# Patient Record
Sex: Female | Born: 1975 | Race: Black or African American | Hispanic: No | Marital: Married | Smoking: Never smoker
Health system: Southern US, Community
[De-identification: ages and names within clinical notes are randomized; demographics above are authoritative.]

## PROBLEM LIST (undated history)

## (undated) DIAGNOSIS — Z800 Family history of malignant neoplasm of digestive organs: Secondary | ICD-10-CM

## (undated) DIAGNOSIS — Z1379 Encounter for other screening for genetic and chromosomal anomalies: Secondary | ICD-10-CM

## (undated) DIAGNOSIS — B009 Herpesviral infection, unspecified: Secondary | ICD-10-CM

## (undated) HISTORY — DX: Herpesviral infection, unspecified: B00.9

## (undated) HISTORY — DX: Family history of malignant neoplasm of digestive organs: Z80.0

## (undated) HISTORY — DX: Encounter for other screening for genetic and chromosomal anomalies: Z13.79

## (undated) HISTORY — PX: OTHER SURGICAL HISTORY: SHX169

---

## 1999-07-04 ENCOUNTER — Other Ambulatory Visit: Admission: RE | Admit: 1999-07-04 | Discharge: 1999-07-04 | Payer: Self-pay

## 2005-03-24 ENCOUNTER — Emergency Department: Payer: Self-pay | Admitting: Emergency Medicine

## 2006-06-25 ENCOUNTER — Ambulatory Visit: Payer: Self-pay | Admitting: Gastroenterology

## 2006-06-25 HISTORY — PX: COLONOSCOPY: SHX174

## 2013-10-14 ENCOUNTER — Ambulatory Visit: Payer: Self-pay | Admitting: Physician Assistant

## 2013-10-14 LAB — URINALYSIS, COMPLETE
Glucose,UR: NEGATIVE mg/dL (ref 0–75)
Nitrite: POSITIVE
RBC,UR: >30 /HPF (ref 0–5)
Specific Gravity: 1.025 (ref 1.003–1.030)
WBC UR: >30 /HPF (ref 0–5)

## 2013-10-16 LAB — URINE CULTURE

## 2013-11-24 LAB — HM PAP SMEAR: HM PAP: NEGATIVE

## 2015-01-19 ENCOUNTER — Ambulatory Visit: Payer: Self-pay | Admitting: Obstetrics and Gynecology

## 2015-01-20 ENCOUNTER — Ambulatory Visit: Payer: Self-pay | Admitting: Obstetrics and Gynecology

## 2015-01-20 HISTORY — PX: COMBINED HYSTEROSCOPY DIAGNOSTIC / D&C: SUR297

## 2015-03-28 LAB — SURGICAL PATHOLOGY

## 2015-04-03 NOTE — Op Note (Signed)
PATIENT NAME:  Audrey Roberson, Audrey Roberson MR#:  [id number] DATE OF BIRTH:  August 29, 1976  DATE OF PROCEDURE:Simonne Come  01/20/2015  PREOPERATIVE DIAGNOSIS: Missed abortion at 6 weeks.   POSTOPERATIVE DIAGNOSIS: Missed abortion at 6 weeks.  PROCEDURE: Suction dilatation and curettage.   ANESTHESIA USED: General.   PRIMARY SURGEON: Florina OuAndreas M. Bonney AidStaebler, MD   ESTIMATED BLOOD LOSS: 20 mL.  OPERATIVE FLUIDS: Crystalloid 400 mL.   URINE OUTPUT: Clear urine 20 mL.   PREOPERATIVE ANTIBIOTICS: Doxycycline 100 mg.   COMPLICATIONS: None.   INTRAOPERATIVE FINDINGS: Roberson 6-8-week sized uterus, slightly retroverted, 7-9 cm, minimal to moderate amount of tissue removed on suction curettage.   SPECIMENS REMOVED: Products of conception.   CONDITION FOLLOWING PROCEDURE: Stable.   PROCEDURE IN DETAIL: Risks, benefits, and alternatives of the procedure were discussed with the patient prior to proceeding to the operating room. The patient had initially opted for expectant management then opted for medical management, both of which were unsuccessful in the patient passing the products of conception. The patient was taken to the operating room where she was placed under general anesthesia. She was prepped and draped in the usual sterile fashion, positioned in the dorsal lithotomy position using Allen stirrups. Timeout was performed. Attention was turned to the patient's pelvis. The bladder was straight catheterized with Roberson red rubber catheter. An operative speculum was placed. The anterior lip of the cervix was grasped with Roberson single-tooth tenaculum. The cervix was sequentially dilated using Pratt dilators. Roberson size 8 flexible suction curette was then introduced into the uterus up to the fundus. Several passes with the suction curette were undertaken. Sharp curettage was then performed noting Roberson good uterine cry throughout. Roberson final pass with the suction curette was then undertaken. The single-tooth tenaculum was removed. The cervix and  tenaculum site were inspected and noted to be hemostatic. The speculum was removed. Sponge, needle, and instrument counts were correct x 2. The patient tolerated the procedure well and was taken to the recovery room in stable condition.   ____________________________ Florina OuAndreas M. Bonney AidStaebler, MD ams:bm D: 01/21/2015 19:45:00 ET T: 01/22/2015 07:32:27 ET JOB#: [id number]  cc: Florina OuAndreas M. Bonney AidStaebler, MD, <Dictator> Lorrene ReidANDREAS M Jaquavian Firkus MD ELECTRONICALLY SIGNED 02/08/2015 21:01

## 2016-07-10 DIAGNOSIS — E6609 Other obesity due to excess calories: Secondary | ICD-10-CM | POA: Diagnosis not present

## 2016-09-12 ENCOUNTER — Ambulatory Visit
Admission: EM | Admit: 2016-09-12 | Discharge: 2016-09-12 | Disposition: A | Discharge status: Home / Self Care | Payer: BLUE CROSS/BLUE SHIELD | Attending: Family Medicine | Admitting: Family Medicine

## 2016-09-12 DIAGNOSIS — R300 Dysuria: Secondary | ICD-10-CM

## 2016-09-12 LAB — URINALYSIS COMPLETE WITH MICROSCOPIC (ARMC ONLY)
Bilirubin Urine: NEGATIVE
Glucose, UA: NEGATIVE mg/dL
Ketones, ur: NEGATIVE mg/dL
Nitrite: NEGATIVE
PH: 7 (ref 5.0–8.0)
PROTEIN: NEGATIVE mg/dL
Specific Gravity, Urine: 1.015 (ref 1.005–1.030)

## 2016-09-12 MED ORDER — SULFAMETHOXAZOLE-TRIMETHOPRIM 800-160 MG PO TABS: 1.0000 | ORAL_TABLET | Freq: Two times a day (BID) | ORAL | 0 refills | Status: AC

## 2016-09-12 NOTE — Discharge Instructions (Signed)
Take medication as prescribed. Rest. Drink plenty of fluids.  ° °Follow up with your primary care physician this week as needed. Return to Urgent care for new or worsening concerns.  ° °

## 2016-09-12 NOTE — ED Provider Notes (Signed)
[facility] ____________________________________________  Time seen: Approximately 1:23 PM  I have reviewed the triage vital signs and the nursing notes.   HISTORY  Chief Complaint Urinary Tract Infection   HPI Audrey Roberson is a 40 y.o. female presents for the complaints of 2-3 days of urinary frequency and burning with urination. Patient reports she has a history of urinary tract infection in the past that felt similarly. Reports has not had a UTI in the last several years. Patient denies any burning discomfort in absence of active urination. Denies any vaginal complaints. Denies any vaginal discharge, vaginal odor, vaginal burning. Denies any concerns of STDs. Patient states that she does sometimes feel like she holds her urine longer than she should at work.  Reports continues to eat and drink well. Reports continues with normal activities.Denies fevers, abdominal pain, back pain, diarrhea or constipation, recent sickness or recent antibiotic use.  No LMP recorded. Patient is not currently having periods (Reason: IUD). Denies pregnancy.  History reviewed. No pertinent past medical history.  There are no active problems to display for this patient.   Past Surgical History:  Procedure Laterality Date  . c-section        No current facility-administered medications for this encounter.   Current Outpatient Prescriptions:  .  sulfamethoxazole-trimethoprim (BACTRIM DS,SEPTRA DS) 800-160 MG tablet, Take 1 tablet by mouth 2 (two) times daily., Disp: 6 tablet, Rfl: 0  Allergies Review of patient's allergies indicates no known allergies.  History reviewed. No pertinent family history.  Social History Social History  Substance Use Topics  . Smoking status: Never Smoker  . Smokeless tobacco: Never Used  . Alcohol use No    Review of Systems Constitutional: No fever/chills Eyes: No visual changes. ENT: No sore throat. Cardiovascular: Denies chest  pain. Respiratory: Denies shortness of breath. Gastrointestinal: No abdominal pain.  No nausea, no vomiting.  No diarrhea.  No constipation. Genitourinary: Positiveor dysuria. Musculoskeletal: Negative for back pain. Skin: Negative for rash. Neurological: Negative for headaches, focal weakness or numbness.  10-point ROS otherwise negative.  ____________________________________________   PHYSICAL EXAM:  VITAL SIGNS: ED Triage Vitals  Enc Vitals Group     BP 09/12/16 1307 103/75     Pulse Rate 09/12/16 1307 78     Resp 09/12/16 1307 18     Temp 09/12/16 1307 98 F (36.7 C)     Temp Source 09/12/16 1307 Oral     SpO2 09/12/16 1307 100 %     Weight 09/12/16 1308 178 lb (80.7 kg)     Height 09/12/16 1308 5\' 8"  (1.727 m)     Head Circumference --      Peak Flow --      Pain Score 09/12/16 1310 3     Pain Loc --      Pain Edu? --      Excl. in GC? --     Constitutional: Alert and oriented. Well appearing and in no acute distress. Eyes: Conjunctivae are normal. PERRL. EOMI. ENT      Head: Normocephalic and atraumatic.      Nose: No congestion/rhinnorhea.      Mouth/Throat: Mucous membranes are moist. Cardiovascular: Normal rate, regular rhythm. Grossly normal heart sounds.  Good peripheral circulation. Respiratory: Normal respiratory effort without tachypnea nor retractions. Breath sounds are clear and equal bilaterally. No wheezes/rales/rhonchi.. Gastrointestinal: Soft and nontender. Normal Bowel sounds. No CVA tenderness. Musculoskeletal: Ambulatory with steady gait. No midline cervical, thoracic or lumbar tenderness to palpation. Neurologic:  Normal  speech and language. No gross focal neurologic deficits are appreciated. Speech is normal. No gait instability.  Skin:  Skin is warm, dry and intact. No rash noted. Psychiatric: Mood and affect are normal. Speech and behavior are normal. Patient exhibits appropriate insight and judgment    ___________________________________________   LABS (all labs ordered are listed, but only abnormal results are displayed)  Labs Reviewed  URINALYSIS COMPLETEWITH MICROSCOPIC ([facility] ONLY) - Abnormal; Notable for the following:       Result Value   Hgb urine dipstick TRACE (*)    Leukocytes, UA TRACE (*)    Bacteria, UA FEW (*)    Squamous Epithelial / LPF 6-30 (*)    All other components within normal limits  URINE CULTURE   ____________________________________________  PROCEDURES Procedures    INITIAL IMPRESSION / ASSESSMENT AND PLAN / ED COURSE  Pertinent labs & imaging results that were available during my care of the patient were reviewed by me and considered in my medical decision making (see chart for details).  Well-appearing patient. Presents for the complaints of dysuria 2-3 days. Urinalysis reviewed. Few bacteria, 6-30 score was epithelial cells, trace hemoglobin and trace leukocytes. Discussed in detail with patient concern for possible contaminated sample and not clear UTI. Patient declined vaginal complaints and declined pelvic exam. Will treat patient for concern for UTI with oral Bactrim 3 days. Will culture urine. Discussed indication, risks and benefits of medications with patient.  Discussed follow up with Primary care physician this week. Discussed follow up and return parameters including no resolution or any worsening concerns. Patient verbalized understanding and agreed to plan.   ____________________________________________   FINAL CLINICAL IMPRESSION(S) / ED DIAGNOSES  Final diagnoses:  Dysuria     Discharge Medication List as of 09/12/2016  1:49 PM    START taking these medications   Details  sulfamethoxazole-trimethoprim (BACTRIM DS,SEPTRA DS) 800-160 MG tablet Take 1 tablet by mouth 2 (two) times daily., Starting Wed 09/12/2016, Until Sat 09/15/2016, Normal        Note: This dictation was prepared with Dragon dictation along with smaller  phrase technology. Any transcriptional errors that result from this process are unintentional.    Clinical Course      Renford Dills, NP 09/12/16 1507

## 2016-09-12 NOTE — ED Triage Notes (Signed)
Patient c/o frequency and discomfort during urination.

## 2016-09-13 LAB — URINE CULTURE: Culture: >=100000 — AB

## 2016-09-14 ENCOUNTER — Telehealth: Payer: Self-pay | Admitting: *Deleted

## 2016-09-14 NOTE — Telephone Encounter (Signed)
Called patient, verified DOB, communicated urine culture positive for bacteria. Patient reported feeling much better. Advised patient to follow up with PCP or [facility] if symptoms return.

## 2017-01-16 DIAGNOSIS — L810 Postinflammatory hyperpigmentation: Secondary | ICD-10-CM | POA: Diagnosis not present

## 2017-01-16 DIAGNOSIS — L700 Acne vulgaris: Secondary | ICD-10-CM | POA: Diagnosis not present

## 2017-03-12 ENCOUNTER — Ambulatory Visit (INDEPENDENT_AMBULATORY_CARE_PROVIDER_SITE_OTHER): Payer: BLUE CROSS/BLUE SHIELD | Admitting: Obstetrics and Gynecology

## 2017-03-12 ENCOUNTER — Encounter: Payer: Self-pay | Admitting: Obstetrics and Gynecology

## 2017-03-12 VITALS — BP 110/74 | HR 67 | Ht 68.0 in | Wt 163.0 lb

## 2017-03-12 DIAGNOSIS — R87612 Low grade squamous intraepithelial lesion on cytologic smear of cervix (LGSIL): Secondary | ICD-10-CM | POA: Diagnosis not present

## 2017-03-12 DIAGNOSIS — Z1151 Encounter for screening for human papillomavirus (HPV): Secondary | ICD-10-CM | POA: Diagnosis not present

## 2017-03-12 DIAGNOSIS — Z800 Family history of malignant neoplasm of digestive organs: Secondary | ICD-10-CM | POA: Diagnosis not present

## 2017-03-12 DIAGNOSIS — Z1239 Encounter for other screening for malignant neoplasm of breast: Secondary | ICD-10-CM

## 2017-03-12 DIAGNOSIS — Z124 Encounter for screening for malignant neoplasm of cervix: Secondary | ICD-10-CM | POA: Diagnosis not present

## 2017-03-12 DIAGNOSIS — Z01419 Encounter for gynecological examination (general) (routine) without abnormal findings: Secondary | ICD-10-CM | POA: Diagnosis not present

## 2017-03-12 DIAGNOSIS — Z30431 Encounter for routine checking of intrauterine contraceptive device: Secondary | ICD-10-CM

## 2017-03-12 DIAGNOSIS — Z1231 Encounter for screening mammogram for malignant neoplasm of breast: Secondary | ICD-10-CM

## 2017-03-12 NOTE — Progress Notes (Signed)
HPI:      [name] is a 41 y.o. G2P0011 who LMP was No LMP recorded. Patient is not currently having periods (Reason: IUD)., presents today for her annual examination.  Her menses are absent due to IUD.  Dysmenorrhea occasional. She does have intermenstrual bleeding. She is much happier with the IUD this yr than last.  Sex activity: single partner, contraception - IUD. Mirena placed 03/09/15.  Last Pap: November 04, 2013  Results were: no abnormalities /neg HPV DNA  Hx of STDs: HSV  Last mammogram: February 10, 2016  Results were: normal--routine follow-up in 12 months There is no FH of breast cancer. There is no FH of ovarian cancer. The patient does do self-breast exams. The pt's mom had colon cancer and recurrence and her mat aunt and uncle had pancreatic cancer. Pt is My Risk neg 2016. Pt never had colonoscopy last yr but would like one this yr due to FH.  Tobacco use: The patient denies current or previous tobacco use. Alcohol use: none Exercise: moderately active  She does get adequate calcium and Vitamin D in her diet.   Past Medical History:  Diagnosis Date  . Family history of colon cancer in mother   . Genetic testing    Negative Colaris  . Routine cultures positive for HSV2     Past Surgical History:  Procedure Laterality Date  . c-section    . COLONOSCOPY  06/25/2006  . COMBINED HYSTEROSCOPY DIAGNOSTIC / D&C  01/20/2015    Family History  Problem Relation Age of Onset  . Colon cancer Mother   . Pancreatic cancer Maternal Uncle   . Pancreatic cancer Maternal Aunt   . Prostate cancer Father    Social History   Social History  . Marital status: Married    Spouse name: N/A  . Number of children: N/A  . Years of education: N/A   Occupational History  . Not on file.   Social History Main Topics  . Smoking status: Never Smoker  . Smokeless tobacco: Never Used  . Alcohol use No  . Drug use: No  . Sexual activity: Yes    Birth control/  protection: IUD   Other Topics Concern  . Not on file   Social History Narrative  . No narrative on file    Current Outpatient Prescriptions:  .  levonorgestrel (MIRENA) 20 MCG/24HR IUD, 1 each by Intrauterine route once., Disp: , Rfl:  .  hydrocortisone 2.5 % cream, , Disp: , Rfl:  .  spironolactone (ALDACTONE) 50 MG tablet, , Disp: , Rfl:  .  tretinoin (RETIN-A) 0.025 % cream, , Disp: , Rfl:   ROS:  Review of Systems  Constitutional: Negative for fever, malaise/fatigue and weight loss.  HENT: Negative for congestion, ear pain and sinus pain.   Respiratory: Negative for cough, shortness of breath and wheezing.   Cardiovascular: Negative for chest pain, orthopnea and leg swelling.  Gastrointestinal: Negative for constipation, diarrhea, nausea and vomiting.  Genitourinary: Negative for dysuria, frequency, hematuria and urgency.       Breast ROS: negative   Musculoskeletal: Negative for back pain, joint pain and myalgias.  Skin: Negative for itching and rash.  Neurological: Negative for dizziness, tingling, focal weakness and headaches.  Endo/Heme/Allergies: Negative for environmental allergies. Does not bruise/bleed easily.  Psychiatric/Behavioral: Negative for depression and suicidal ideas. The patient is not nervous/anxious and does not have insomnia.     Objective: BP 110/74 (BP Location: Left Arm, Patient Position:  Sitting, Cuff Size: Normal)   Pulse 67   Ht  (1.727 m)   Wt 163 lb (73.9 kg)   BMI 24.78 kg/m    Physical Exam  Constitutional: She is oriented to person, place, and time. She appears well-developed and well-nourished.  Genitourinary: Vagina normal and uterus normal. No erythema or tenderness in the vagina. No vaginal discharge found. Right adnexum does not display mass and does not display tenderness. Left adnexum does not display mass and does not display tenderness.  Cervix exhibits visible IUD strings. Cervix does not exhibit motion tenderness or  polyp. Uterus is not enlarged or tender.  Neck: Normal range of motion. No thyromegaly present.  Cardiovascular: Normal rate, regular rhythm and normal heart sounds.   No murmur heard. Pulmonary/Chest: Effort normal and breath sounds normal. Right breast exhibits no mass, no nipple discharge, no skin change and no tenderness. Left breast exhibits no mass, no nipple discharge, no skin change and no tenderness.  Abdominal: Soft. There is no tenderness. There is no guarding.  Musculoskeletal: Normal range of motion.  Neurological: She is alert and oriented to person, place, and time. No cranial nerve deficit.  Psychiatric: She has a normal mood and affect. Her behavior is normal.  Vitals reviewed.    Assessment/Plan: Encounter for annual routine gynecological examination  Cervical cancer screening - Plan: IGP, Aptima HPV  Screening for HPV (human papillomavirus) - Plan: IGP, Aptima HPV  Encounter for routine checking of intrauterine contraceptive device (IUD)  Family history of colon cancer - Pt is My Risk neg but due for scr colonoscopy due to FH. Will refer to GI. Cont Vit D supp. - Plan: Ambulatory referral to Gastroenterology  Screening for breast cancer - Pt to sched mammo. - Plan: MM DIGITAL SCREENING BILATERAL            GYN counsel mammography screening, adequate intake of calcium and vitamin D, diet and exercise     F/U  Return in about 1 year (around 03/12/2018).  Arleen Bar B. Tonesha Tsou, PA-C 03/12/2017 12:05 PM

## 2017-03-15 LAB — IGP, APTIMA HPV
HPV APTIMA: NEGATIVE
PAP Smear Comment: 0

## 2017-03-18 ENCOUNTER — Telehealth: Payer: Self-pay | Admitting: Obstetrics and Gynecology

## 2017-03-18 DIAGNOSIS — R87612 Low grade squamous intraepithelial lesion on cytologic smear of cervix (LGSIL): Secondary | ICD-10-CM

## 2017-03-18 NOTE — Telephone Encounter (Signed)
Pt aware of abn pap (LSIL, neg high risk HPV DNA), needing colpo. No hx of abn paps. Sara, pls sched with AMS. Thx.

## 2017-03-19 NOTE — Telephone Encounter (Signed)
Called and schedule pt  04/09/17 with AMS

## 2017-03-25 ENCOUNTER — Other Ambulatory Visit: Payer: Self-pay

## 2017-03-25 DIAGNOSIS — Z1211 Encounter for screening for malignant neoplasm of colon: Secondary | ICD-10-CM

## 2017-03-25 NOTE — Progress Notes (Signed)
Gastroenterology Pre-Procedure Review  Request Date: Apr 08, 2017 Requesting Physician: Dr. Servando Snare  PATIENT REVIEW QUESTIONS: The patient responded to the following health history questions as indicated:    1. Are you having any GI issues? no 2. Do you have a personal history of Polyps? no 3. Do you have a family history of Colon Cancer or Polyps? yes (Mother Colon Cancer) 4. Diabetes Mellitus? no 5. Joint replacements in the past 12 months?no 6. Major health problems in the past 3 months?no 7. Any artificial heart valves, MVP, or defibrillator?no    MEDICATIONS & ALLERGIES:    Patient reports the following regarding taking any anticoagulation/antiplatelet therapy:   Plavix, Coumadin, Eliquis, Xarelto, Lovenox, Pradaxa, Brilinta, or Effient? no Aspirin? no  Patient confirms/reports the following medications:  Current Outpatient Prescriptions  Medication Sig Dispense Refill  . hydrocortisone 2.5 % cream     . levonorgestrel (MIRENA) 20 MCG/24HR IUD 1 each by Intrauterine route once.    Marland Kitchen spironolactone (ALDACTONE) 50 MG tablet     . tretinoin (RETIN-A) 0.025 % cream      No current facility-administered medications for this visit.     Patient confirms/reports the following allergies:  No Known Allergies  No orders of the defined types were placed in this encounter.   AUTHORIZATION INFORMATION Primary Insurance: 1D#: Group #:  Secondary Insurance: 1D#: Group #:  SCHEDULE INFORMATION: Date: May 7th, 2018 Time: Location: [facility]

## 2017-03-26 ENCOUNTER — Telehealth: Payer: Self-pay | Admitting: Gastroenterology

## 2017-03-26 NOTE — Telephone Encounter (Signed)
03/26/17 Spoke with Jasmine December at [facility] and NO prior auth required for 16109 / Z80.0 Ref# [id number].

## 2017-04-04 ENCOUNTER — Encounter: Payer: Self-pay | Admitting: *Deleted

## 2017-04-08 ENCOUNTER — Ambulatory Visit: Payer: BLUE CROSS/BLUE SHIELD | Admitting: Anesthesiology

## 2017-04-08 ENCOUNTER — Encounter
Admission: RE | Disposition: A | Discharge status: Home / Self Care | Payer: Self-pay | Source: Ambulatory Visit | Attending: Gastroenterology

## 2017-04-08 ENCOUNTER — Ambulatory Visit
Admission: RE | Admit: 2017-04-08 | Discharge: 2017-04-08 | Disposition: A | Discharge status: Home / Self Care | Payer: BLUE CROSS/BLUE SHIELD | Source: Ambulatory Visit | Attending: Gastroenterology | Admitting: Gastroenterology

## 2017-04-08 DIAGNOSIS — Z800 Family history of malignant neoplasm of digestive organs: Secondary | ICD-10-CM

## 2017-04-08 DIAGNOSIS — K5730 Diverticulosis of large intestine without perforation or abscess without bleeding: Secondary | ICD-10-CM | POA: Diagnosis not present

## 2017-04-08 DIAGNOSIS — Z79899 Other long term (current) drug therapy: Secondary | ICD-10-CM | POA: Diagnosis not present

## 2017-04-08 DIAGNOSIS — Z1211 Encounter for screening for malignant neoplasm of colon: Secondary | ICD-10-CM | POA: Diagnosis not present

## 2017-04-08 DIAGNOSIS — K641 Second degree hemorrhoids: Secondary | ICD-10-CM | POA: Insufficient documentation

## 2017-04-08 HISTORY — PX: COLONOSCOPY WITH PROPOFOL: SHX5780

## 2017-04-08 SURGERY — COLONOSCOPY WITH PROPOFOL
Anesthesia: Monitor Anesthesia Care | Wound class: Contaminated

## 2017-04-08 MED ORDER — STERILE WATER FOR IRRIGATION IR SOLN
Status: DC | PRN
  Administered 2017-04-08: 08:00:00

## 2017-04-08 MED ORDER — LIDOCAINE HCL (CARDIAC) 20 MG/ML IV SOLN
INTRAVENOUS | Status: DC | PRN
Start: 2017-04-08 — End: 2017-04-08
  Administered 2017-04-08: 50 mg via INTRAVENOUS

## 2017-04-08 MED ORDER — LACTATED RINGERS IV SOLN
INTRAVENOUS | Status: DC
  Administered 2017-04-08: 08:00:00 via INTRAVENOUS

## 2017-04-08 MED ORDER — ACETAMINOPHEN 325 MG PO TABS: 325.0000 mg | ORAL_TABLET | ORAL | Status: DC | PRN

## 2017-04-08 MED ORDER — PROPOFOL 10 MG/ML IV BOLUS
INTRAVENOUS | Status: DC | PRN
  Administered 2017-04-08: 50 mg via INTRAVENOUS
  Administered 2017-04-08: 40 mg via INTRAVENOUS
  Administered 2017-04-08: 100 mg via INTRAVENOUS
  Administered 2017-04-08: 50 mg via INTRAVENOUS

## 2017-04-08 MED ORDER — ACETAMINOPHEN 160 MG/5ML PO SOLN: 325.0000 mg | ORAL | Status: DC | PRN

## 2017-04-08 SURGICAL SUPPLY — 23 items

## 2017-04-08 NOTE — Anesthesia Postprocedure Evaluation (Signed)
Anesthesia Post Note  Patient: Audrey Roberson  Procedure(s) Performed: Procedure(s) (LRB): COLONOSCOPY WITH PROPOFOL (N/A)  Patient location during evaluation: PACU Anesthesia Type: MAC Level of consciousness: awake and alert and oriented Pain management: satisfactory to patient Vital Signs Assessment: post-procedure vital signs reviewed and stable Respiratory status: spontaneous breathing, nonlabored ventilation and respiratory function stable Cardiovascular status: blood pressure returned to baseline and stable Postop Assessment: Adequate PO intake and No signs of nausea or vomiting Anesthetic complications: no    Raliegh Ip

## 2017-04-08 NOTE — Anesthesia Procedure Notes (Signed)
Procedure Name: MAC Date/Time: 04/08/2017 8:20 AM Performed by: Cameron Ali Pre-anesthesia Checklist: Patient identified, Emergency Drugs available, Suction available, Timeout performed and Patient being monitored Patient Re-evaluated:Patient Re-evaluated prior to inductionOxygen Delivery Method: Nasal cannula Placement Confirmation: positive ETCO2

## 2017-04-08 NOTE — Op Note (Signed)
[facility] Gastroenterology Patient Name: Audrey Roberson Procedure Date: 04/08/2017 8:13 AM MRN: [id number] Account #: [id number] Date of Birth: December 21, 1975 Admit Type: Outpatient Age: 41 Room: [facility] OR ROOM 01 Gender: Female Note Status: Finalized Procedure:            Colonoscopy Indications:          Family history of anal canal cancer in a first-degree                        relative Providers:            Midge Minium MD, MD Medicines:            Propofol per Anesthesia Complications:        No immediate complications. Procedure:            Pre-Anesthesia Assessment:                       - Prior to the procedure, a History and Physical was                        performed, and patient medications and allergies were                        reviewed. The patient's tolerance of previous                        anesthesia was also reviewed. The risks and benefits of                        the procedure and the sedation options and risks were                        discussed with the patient. All questions were                        answered, and informed consent was obtained. Prior                        Anticoagulants: The patient has taken no previous                        anticoagulant or antiplatelet agents. ASA Grade                        Assessment: II - A patient with mild systemic disease.                        After reviewing the risks and benefits, the patient was                        deemed in satisfactory condition to undergo the                        procedure.                       After obtaining informed consent, the colonoscope was                        passed under direct vision. Throughout the  procedure,                        the patient's blood pressure, pulse, and oxygen                        saturations were monitored continuously. The Olympus CF                        H180AL Colonoscope (S#: [id number]) was introduced through               the anus and advanced to the the cecum, identified by                        appendiceal orifice and ileocecal valve. The                        colonoscopy was performed without difficulty. The                        patient tolerated the procedure well. The quality of                        the bowel preparation was good. Findings:      The perianal and digital rectal examinations were normal.      A few small-mouthed diverticula were found in the sigmoid colon and       descending colon.      Non-bleeding internal hemorrhoids were found during retroflexion. The       hemorrhoids were Grade II (internal hemorrhoids that prolapse but reduce       spontaneously). Impression:           - Diverticulosis in the sigmoid colon and in the                        descending colon.                       - Non-bleeding internal hemorrhoids.                       - No specimens collected. Recommendation:       - Discharge patient to home.                       - Resume previous diet.                       - Continue present medications.                       - Repeat colonoscopy in 5 years for surveillance. Procedure Code(s):    --- Professional ---                       [phone], Colonoscopy, flexible; diagnostic, including                        collection of specimen(s) by brushing or washing, when                        performed (separate procedure) Diagnosis Code(s):    --- Professional ---  Z80.0, Family history of malignant neoplasm of                        digestive organs CPT copyright 2016 American Medical Association. All rights reserved. The codes documented in this report are preliminary and upon coder review may  be revised to meet current compliance requirements. Midge Minium MD, MD 04/08/2017 8:34:59 AM This report has been signed electronically. Number of Addenda: 0 Note Initiated On: 04/08/2017 8:13 AM Scope Withdrawal Time: 0 hours 6 minutes 32  seconds  Total Procedure Duration: 0 hours 9 minutes 38 seconds       [facility]

## 2017-04-08 NOTE — Anesthesia Preprocedure Evaluation (Signed)
Anesthesia Evaluation  Patient identified by MRN, date of birth, ID band Patient awake    Reviewed: Allergy & Precautions, H&P , NPO status , Patient's Chart, lab work & pertinent test results  Airway Mallampati: I  TM Distance: >3 FB Neck ROM: full    Dental no notable dental hx.    Pulmonary    Pulmonary exam normal        Cardiovascular Normal cardiovascular exam     Neuro/Psych    GI/Hepatic   Endo/Other    Renal/GU      Musculoskeletal   Abdominal   Peds  Hematology   Anesthesia Other Findings   Reproductive/Obstetrics                             Anesthesia Physical Anesthesia Plan  ASA: II  Anesthesia Plan: MAC   Post-op Pain Management:    Induction:   Airway Management Planned:   Additional Equipment:   Intra-op Plan:   Post-operative Plan:   Informed Consent: I have reviewed the patients History and Physical, chart, labs and discussed the procedure including the risks, benefits and alternatives for the proposed anesthesia with the patient or authorized representative who has indicated his/her understanding and acceptance.     Plan Discussed with:   Anesthesia Plan Comments:         Anesthesia Quick Evaluation

## 2017-04-08 NOTE — H&P (Addendum)
   Midge Miniumarren Murline Weigel, MD [facility] [address] [place], [place] [address] Phone: [phone] Fax : [phone]  Primary Care Physician:  Patient, No Pcp Per Primary Gastroenterologist:  Dr. Servando SnareWohl  Pre-Procedure History & Physical: HPI:  Audrey Roberson is a 41 y.o. female is here for a mother with colon cancer.   Past Medical History:  Diagnosis Date  . Family history of colon cancer in mother   . Genetic testing    Negative Colaris  . Routine cultures positive for HSV2     Past Surgical History:  Procedure Laterality Date  . c-section    . COLONOSCOPY  06/25/2006  . COMBINED HYSTEROSCOPY DIAGNOSTIC / D&C  01/20/2015    Prior to Admission medications   Medication Sig Start Date End Date Taking? Authorizing Provider  B Complex-C (SUPER B COMPLEX PO) Take by mouth daily.   Yes [provider]  Cholecalciferol (VITAMIN D PO) Take by mouth daily.   Yes [provider]  hydrocortisone 2.5 % cream  01/20/17  Yes [provider]  levonorgestrel (MIRENA) 20 MCG/24HR IUD 1 each by Intrauterine route once.   Yes [provider]  Misc Natural Products (CORTISOL PO) Take by mouth daily.   Yes [provider]  Omega-3 Fatty Acids (FISH OIL PO) Take by mouth daily.   Yes [provider]  OVER THE COUNTER MEDICATION daily. Liver Rescue   Yes [provider]  spironolactone (ALDACTONE) 50 MG tablet  03/04/17  Yes [provider]  tretinoin (RETIN-A) 0.025 % cream  01/20/17  Yes [provider]    Allergies as of 03/25/2017  . (No Known Allergies)    Family History  Problem Relation Age of Onset  . Colon cancer Mother   . Pancreatic cancer Maternal Uncle   . Pancreatic cancer Maternal Aunt   . Prostate cancer Father     Social History   Social History  . Marital status: Married    Spouse name: N/A  . Number of children: N/A  . Years of education: N/A   Occupational History  . Not on file.    Social History Main Topics  . Smoking status: Never Smoker  . Smokeless tobacco: Never Used  . Alcohol use Yes     Comment: rare - Holidays  . Drug use: No  . Sexual activity: Yes    Birth control/ protection: IUD   Other Topics Concern  . Not on file   Social History Narrative  . No narrative on file    Review of Systems: See HPI, otherwise negative ROS  Physical Exam: BP 98/69   Pulse 90   Temp 99.1 F (37.3 C) (Temporal)   Resp 16   Ht 5\' 8"  (1.727 m)   Wt 159 lb (72.1 kg)   SpO2 98%   BMI 24.18 kg/m  General:   Alert,  pleasant and cooperative in NAD Head:  Normocephalic and atraumatic. Neck:  Supple; no masses or thyromegaly. Lungs:  Clear throughout to auscultation.    Heart:  Regular rate and rhythm. Abdomen:  Soft, nontender and nondistended. Normal bowel sounds, without guarding, and without rebound.   Neurologic:  Alert and  oriented x4;  grossly normal neurologically.  Impression/Plan: Audrey ComeAlecia A Halbleib is now here to undergo a colonoscopy due to mother with colon cancer.  Risks, benefits, and alternatives regarding colonoscopy have been reviewed with the patient.  Questions have been answered.  All parties agreeable.

## 2017-04-08 NOTE — Transfer of Care (Signed)
Immediate Anesthesia Transfer of Care Note  Patient: Audrey Roberson  Procedure(s) Performed: Procedure(s): COLONOSCOPY WITH PROPOFOL (N/A)  Patient Location: PACU  Anesthesia Type: MAC  Level of Consciousness: awake, alert  and patient cooperative  Airway and Oxygen Therapy: Patient Spontanous Breathing and Patient connected to supplemental oxygen  Post-op Assessment: Post-op Vital signs reviewed, Patient's Cardiovascular Status Stable, Respiratory Function Stable, Patent Airway and No signs of Nausea or vomiting  Post-op Vital Signs: Reviewed and stable  Complications: No apparent anesthesia complications

## 2017-04-09 ENCOUNTER — Ambulatory Visit (INDEPENDENT_AMBULATORY_CARE_PROVIDER_SITE_OTHER): Payer: BLUE CROSS/BLUE SHIELD | Admitting: Obstetrics and Gynecology

## 2017-04-09 ENCOUNTER — Encounter: Payer: Self-pay | Admitting: Gastroenterology

## 2017-04-09 VITALS — BP 100/68 | HR 70 | Wt 166.0 lb

## 2017-04-09 DIAGNOSIS — R87612 Low grade squamous intraepithelial lesion on cytologic smear of cervix (LGSIL): Secondary | ICD-10-CM | POA: Diagnosis not present

## 2017-04-09 NOTE — Progress Notes (Signed)
GYNECOLOGY CLINIC COLPOSCOPY PROCEDURE NOTE  41 y.o. 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 Discussed underlying role for HPV infection in the development of cervical dysplasia, its natural history and progression/regression, need for surveillance.  We discussed LGSIL pap HPV negative preferred triage is follow up pap and co-testing in 1 year.  Given low probability of dysplasia decision made to proceed with colposcopy but not to obtain a random biopsy if no visible lesions.  Is the patient  pregnant: No LMP: No LMP recorded. Patient is not currently having periods (Reason: IUD). Smoking status:  Metrics: Intervention Frequency ACO  Documented Smoking Status Yearly  Screened one or more times in 24 months  Cessation Counseling or  Active cessation medication Past 24 months  Past 24 months   Guideline developer: UpToDate (See UpToDate for funding source) Date Released: 2014   Contraception: IUD   Patient given informed consent, signed copy in the chart, time out was performed.  The patient was position in dorsal lithotomy position. Speculum was placed the cervix was visualized.   After application of acetic acid colposcopic inspection of the cervix was undertaken.   Colposcopy adequate, full visualization of transformation zone: Yes no visible lesions; biopsies were not obtained.   ECC specimen obtained:  No All specimens were labeled and sent to pathology.   Patient was given post procedure instructions.  Will follow up pathology and manage accordingly.  Routine preventative health maintenance measures emphasized.  Physical Exam  Genitourinary:      Vena AustriaAndreas Ruger Saxer, MD, Merlinda FrederickFACOG [facility], [facility]

## 2017-05-21 ENCOUNTER — Ambulatory Visit
Admission: EM | Admit: 2017-05-21 | Discharge: 2017-05-21 | Disposition: A | Discharge status: Home / Self Care | Payer: BLUE CROSS/BLUE SHIELD | Attending: Family Medicine | Admitting: Family Medicine

## 2017-05-21 DIAGNOSIS — N3001 Acute cystitis with hematuria: Secondary | ICD-10-CM

## 2017-05-21 DIAGNOSIS — N390 Urinary tract infection, site not specified: Secondary | ICD-10-CM

## 2017-05-21 LAB — URINALYSIS, COMPLETE (UACMP) WITH MICROSCOPIC
Bilirubin Urine: NEGATIVE
GLUCOSE, UA: NEGATIVE mg/dL
Ketones, ur: NEGATIVE mg/dL
Nitrite: NEGATIVE
PH: 7 (ref 5.0–8.0)
Protein, ur: NEGATIVE mg/dL
Specific Gravity, Urine: 1.015 (ref 1.005–1.030)

## 2017-05-21 MED ORDER — PHENAZOPYRIDINE HCL 200 MG PO TABS
200.0000 mg | ORAL_TABLET | Freq: Three times a day (TID) | ORAL | 0 refills | Status: AC | PRN
Start: 2017-05-21 — End: ?

## 2017-05-21 MED ORDER — CEPHALEXIN 500 MG PO CAPS: 500.0000 mg | ORAL_CAPSULE | Freq: Three times a day (TID) | ORAL | 0 refills | Status: AC

## 2017-05-21 NOTE — ED Triage Notes (Signed)
Patient complains of urinary frequency, urgency and painful urination that started 3 days ago and has been worsening.

## 2017-05-21 NOTE — ED Provider Notes (Signed)
[facility]    CSN: [id number] Arrival date & time: 05/21/17  1105     History   Chief Complaint Chief Complaint  Patient presents with  . Urinary Tract Infection    HPI Audrey Roberson is a 41 y.o. female.   Patient is a [age] year old black female who has a history of UTIs who started having symptoms days ago. She states started on Sunday and is now Tuesday. She states she's had a history of UTIs before in the past. She reports that when she is unable to go to the bathroom on a regular basis and has to hold her urine seems UTIs occur then. She denies any medical problems of significance. She does not smoke. Family history is positive for mother with colon cancer. Other than do see a colonoscopy no other surgeries or operations no known drug allergies.   The history is provided by the patient. No language interpreter was used.  Urinary Tract Infection  Pain quality:  Sharp Pain severity:  Moderate Timing:  Constant Progression:  Worsening Chronicity:  New Recent urinary tract infections: no   Relieved by:  Nothing Worsened by:  Nothing Ineffective treatments:  None tried Urinary symptoms: discolored urine, frequent urination and hematuria   Associated symptoms: abdominal pain   Risk factors: recurrent urinary tract infections and sexually active     Past Medical History:  Diagnosis Date  . Family history of colon cancer in mother   . Genetic testing    Negative Colaris  . Routine cultures positive for HSV2     Patient Active Problem List   Diagnosis Date Noted  . Family history of malignant neoplasm of gastrointestinal tract   . LGSIL on Pap smear of cervix 03/18/2017  . Family history of colon cancer 03/12/2017    Past Surgical History:  Procedure Laterality Date  . c-section    . COLONOSCOPY  06/25/2006  . COLONOSCOPY WITH PROPOFOL N/A 04/08/2017   Procedure: COLONOSCOPY WITH PROPOFOL;  Surgeon: Midge Minium, MD;  Location: [facility];   Service: Endoscopy;  Laterality: N/A;  . COMBINED HYSTEROSCOPY DIAGNOSTIC / D&C  01/20/2015    OB History    Gravida Para Term Preterm AB Living   2 1     1 1    SAB TAB Ectopic Multiple Live Births   1               Home Medications    Prior to Admission medications   Medication Sig Start Date End Date Taking? Authorizing Provider  B Complex-C (SUPER B COMPLEX PO) Take by mouth daily.   Yes [provider]  Cholecalciferol (VITAMIN D PO) Take by mouth daily.   Yes [provider]  hydrocortisone 2.5 % cream  01/20/17  Yes [provider]  levonorgestrel (MIRENA) 20 MCG/24HR IUD 1 each by Intrauterine route once.   Yes [provider]  Misc Natural Products (CORTISOL PO) Take by mouth daily.   Yes [provider]  Omega-3 Fatty Acids (FISH OIL PO) Take by mouth daily.   Yes [provider]  OVER THE COUNTER MEDICATION daily. Liver Rescue   Yes [provider]  spironolactone (ALDACTONE) 50 MG tablet  03/04/17  Yes [provider]  tretinoin (RETIN-A) 0.025 % cream  01/20/17  Yes [provider]  cephALEXin (KEFLEX) 500 MG capsule Take 1 capsule (500 mg total) by mouth 3 (three) times daily. 05/21/17   Hassan Rowan, MD  phenazopyridine (PYRIDIUM) 200 MG  tablet Take 1 tablet (200 mg total) by mouth 3 (three) times daily as needed for pain. 05/21/17   Hassan RowanWade, Talayia Hjort, MD    Family History Family History  Problem Relation Age of Onset  . Colon cancer Mother   . Pancreatic cancer Maternal Uncle   . Pancreatic cancer Maternal Aunt   . Prostate cancer Father     Social History Social History  Substance Use Topics  . Smoking status: Never Smoker  . Smokeless tobacco: Never Used  . Alcohol use Yes     Comment: rare - Holidays     Allergies   Patient has no known allergies.   Review of Systems Review of Systems  Gastrointestinal: Positive for abdominal pain.  Genitourinary: Positive for dysuria,  frequency and pelvic pain.  All other systems reviewed and are negative.    Physical Exam Triage Vital Signs ED Triage Vitals  Enc Vitals Group     BP 05/21/17 1247 108/71     Pulse Rate 05/21/17 1247 69     Resp 05/21/17 1247 18     Temp 05/21/17 1247 98.1 F (36.7 C)     Temp Source 05/21/17 1247 Oral     SpO2 05/21/17 1247 100 %     Weight 05/21/17 1244 167 lb (75.8 kg)     Height 05/21/17 1244 5\' 8"  (1.727 m)     Head Circumference --      Peak Flow --      Pain Score 05/21/17 1244 8     Pain Loc --      Pain Edu? --      Excl. in GC? --    No data found.   Updated Vital Signs BP 108/71 (BP Location: Left Arm)   Pulse 69   Temp 98.1 F (36.7 C) (Oral)   Resp 18   Ht 5\' 8"  (1.727 m)   Wt 167 lb (75.8 kg)   SpO2 100%   BMI 25.39 kg/m   Visual Acuity Right Eye Distance:   Left Eye Distance:   Bilateral Distance:    Right Eye Near:   Left Eye Near:    Bilateral Near:     Physical Exam  Constitutional: She is oriented to person, place, and time. She appears well-developed and well-nourished.  HENT:  Head: Normocephalic and atraumatic.  Eyes: Pupils are equal, round, and reactive to light.  Neck: Normal range of motion.  Pulmonary/Chest: Effort normal.  Abdominal: Soft. Bowel sounds are normal. She exhibits no distension. There is no tenderness.  Musculoskeletal: Normal range of motion.  Neurological: She is alert and oriented to person, place, and time.  Skin: Skin is warm.  Psychiatric: She has a normal mood and affect.  Vitals reviewed.    UC Treatments / Results  Labs (all labs ordered are listed, but only abnormal results are displayed) Labs Reviewed  URINALYSIS, COMPLETE (UACMP) WITH MICROSCOPIC - Abnormal; Notable for the following:       Result Value   APPearance HAZY (*)    Hgb urine dipstick MODERATE (*)    Leukocytes, UA MODERATE (*)    Squamous Epithelial / LPF 0-5 (*)    Bacteria, UA FEW (*)    All other components within normal  limits  URINE CULTURE    EKG  EKG Interpretation None       Radiology No results found.  Procedures Procedures (including critical care time)  Medications Ordered in UC Medications - No data to display   Initial Impression /  Assessment and Plan / UC Course  I have reviewed the triage vital signs and the nursing notes.  Pertinent labs & imaging results that were available during my care of the patient were reviewed by me and considered in my medical decision making (see chart for details).    Patient will be placed on Keflex 500 mg 1 tablet 3 times a day for 7 days also (Pyridium 3 times a day for the next 5 days. Follow-up with PCP in a week if not better. Your recent for culture sensitivity.  Final Clinical Impressions(s) / UC Diagnoses   Final diagnoses:  Lower urinary tract infectious disease  Acute cystitis with hematuria    New Prescriptions New Prescriptions   CEPHALEXIN (KEFLEX) 500 MG CAPSULE    Take 1 capsule (500 mg total) by mouth 3 (three) times daily.   PHENAZOPYRIDINE (PYRIDIUM) 200 MG TABLET    Take 1 tablet (200 mg total) by mouth 3 (three) times daily as needed for pain.     Note: This dictation was prepared with Dragon dictation along with smaller phrase technology. Any transcriptional errors that result from this process are unintentional.   Hassan Rowan, MD 05/21/17 1351

## 2017-05-22 LAB — URINE CULTURE
Culture: <10000 — AB
SPECIAL REQUESTS: NORMAL

## 2017-05-30 ENCOUNTER — Telehealth: Payer: Self-pay | Admitting: Emergency Medicine

## 2017-05-30 NOTE — Telephone Encounter (Signed)
Patient called stated she saw Dr. Thurmond ButtsWade last week for a UTI and he had offered her diflucan to take after the keflex he prescribed.  Patient stated she didn't think she would need it but after taking the antibiotic feels she does need it. Per Sherrlyn HockMichael Harris, PA this nurse called in diflucan 150mg  x2 take 1 pill now and IF NEEDED take 2nd pill 7 days from today.  Called in to [facility], [place]

## 2017-06-17 DIAGNOSIS — L700 Acne vulgaris: Secondary | ICD-10-CM | POA: Diagnosis not present

## 2017-06-17 DIAGNOSIS — L680 Hirsutism: Secondary | ICD-10-CM | POA: Diagnosis not present

## 2017-11-16 ENCOUNTER — Other Ambulatory Visit: Payer: Self-pay

## 2017-11-16 ENCOUNTER — Encounter: Payer: Self-pay | Admitting: Gynecology

## 2017-11-16 ENCOUNTER — Ambulatory Visit
Admission: EM | Admit: 2017-11-16 | Discharge: 2017-11-16 | Disposition: A | Discharge status: Home / Self Care | Payer: BLUE CROSS/BLUE SHIELD | Attending: Family Medicine | Admitting: Family Medicine

## 2017-11-16 ENCOUNTER — Ambulatory Visit
Admission: RE | Admit: 2017-11-16 | Discharge: 2017-11-16 | Disposition: A | Discharge status: Home / Self Care | Payer: BLUE CROSS/BLUE SHIELD | Source: Ambulatory Visit | Attending: Family Medicine | Admitting: Family Medicine

## 2017-11-16 ENCOUNTER — Telehealth: Payer: Self-pay | Admitting: Family Medicine

## 2017-11-16 DIAGNOSIS — M25571 Pain in right ankle and joints of right foot: Secondary | ICD-10-CM | POA: Diagnosis not present

## 2017-11-16 DIAGNOSIS — M79604 Pain in right leg: Secondary | ICD-10-CM

## 2017-11-16 DIAGNOSIS — M79606 Pain in leg, unspecified: Secondary | ICD-10-CM | POA: Diagnosis not present

## 2017-11-16 DIAGNOSIS — M7989 Other specified soft tissue disorders: Secondary | ICD-10-CM | POA: Diagnosis not present

## 2017-11-16 MED ORDER — PREDNISONE 20 MG PO TABS: 40.0000 mg | ORAL_TABLET | Freq: Every day | ORAL | 0 refills | Status: AC

## 2017-11-16 MED ORDER — HYDROCODONE-ACETAMINOPHEN 5-325 MG PO TABS: ORAL_TABLET | ORAL | 0 refills | Status: AC

## 2017-11-16 NOTE — ED Triage Notes (Signed)
Patient c/o right ankle pain / swollen.

## 2017-11-16 NOTE — Discharge Instructions (Signed)
Recommendations after ultrasound

## 2017-11-16 NOTE — ED Provider Notes (Signed)
[facility]    CSN: [id number] Arrival date & time: 11/16/17  1321     History   Chief Complaint Chief Complaint  Patient presents with  . Ankle Pain    HPI Audrey Roberson is a 41 y.o. female.   41 yo female with a c/o 3 days of right ankle pain. Denies any injuries, falls, fevers, chills, rash. States yesterday also felt some slight pain discomfort further up the leg into the thigh. States 6 days ago was a passenger during a long car ride of 6 hours and did not stop to walk or stretch. Denies any chest pains or shortness of breath.    The history is provided by the patient.  Ankle Pain    Past Medical History:  Diagnosis Date  . Family history of colon cancer in mother   . Genetic testing    Negative Colaris  . Routine cultures positive for HSV2     Patient Active Problem List   Diagnosis Date Noted  . Family history of malignant neoplasm of gastrointestinal tract   . LGSIL on Pap smear of cervix 03/18/2017  . Family history of colon cancer 03/12/2017    Past Surgical History:  Procedure Laterality Date  . c-section    . COLONOSCOPY  06/25/2006  . COLONOSCOPY WITH PROPOFOL N/A 04/08/2017   Procedure: COLONOSCOPY WITH PROPOFOL;  Surgeon: Midge Minium, MD;  Location: [facility];  Service: Endoscopy;  Laterality: N/A;  . COMBINED HYSTEROSCOPY DIAGNOSTIC / D&C  01/20/2015    OB History    Gravida Para Term Preterm AB Living   2 1     1 1    SAB TAB Ectopic Multiple Live Births   1               Home Medications    Prior to Admission medications   Medication Sig Start Date End Date Taking? Authorizing Provider  B Complex-C (SUPER B COMPLEX PO) Take by mouth daily.   Yes [provider]  Cholecalciferol (VITAMIN D PO) Take by mouth daily.   Yes [provider]  hydrocortisone 2.5 % cream  01/20/17  Yes [provider]  levonorgestrel (MIRENA) 20 MCG/24HR IUD 1 each by Intrauterine route once.   Yes [provider]  Misc Natural Products (CORTISOL PO) Take by mouth daily.   Yes [provider]  Omega-3 Fatty Acids (FISH OIL PO) Take by mouth daily.   Yes [provider]  tretinoin (RETIN-A) 0.025 % cream  01/20/17  Yes [provider]  cephALEXin (KEFLEX) 500 MG capsule Take 1 capsule (500 mg total) by mouth 3 (three) times daily. 05/21/17   Hassan Rowan, MD  HYDROcodone-acetaminophen (NORCO/VICODIN) 5-325 MG tablet 1-2 tabs po bid prn 11/16/17   Payton Mccallum, MD  OVER THE COUNTER MEDICATION daily. Liver Rescue    [provider]  phenazopyridine (PYRIDIUM) 200 MG tablet Take 1 tablet (200 mg total) by mouth 3 (three) times daily as needed for pain. 05/21/17   Hassan Rowan, MD  predniSONE (DELTASONE) 20 MG tablet Take 2 tablets (40 mg total) by mouth daily. 11/16/17   Payton Mccallum, MD  spironolactone (ALDACTONE) 50 MG tablet  03/04/17   [provider]    Family History Family History  Problem Relation Age of Onset  . Colon cancer Mother   . Pancreatic cancer Maternal Uncle   . Pancreatic cancer Maternal Aunt   . Prostate cancer Father     Social History Social  History   Tobacco Use  . Smoking status: Never Smoker  . Smokeless tobacco: Never Used  Substance Use Topics  . Alcohol use: Yes    Comment: rare - Holidays  . Drug use: No     Allergies   Patient has no known allergies.   Review of Systems Review of Systems   Physical Exam Triage Vital Signs ED Triage Vitals  Enc Vitals Group     BP 11/16/17 1341 105/67     Pulse Rate 11/16/17 1341 77     Resp 11/16/17 1341 16     Temp 11/16/17 1341 99 F (37.2 C)     Temp Source 11/16/17 1341 Oral     SpO2 11/16/17 1341 100 %     Weight 11/16/17 1340 195 lb (88.5 kg)     Height 11/16/17 1340 5\' 8"  (1.727 m)     Head Circumference --      Peak Flow --      Pain Score 11/16/17 1340 3     Pain Loc --      Pain Edu? --      Excl. in GC? --    No data found.  Updated  Vital Signs BP 105/67 (BP Location: Left Arm)   Pulse 77   Temp 99 F (37.2 C) (Oral)   Resp 16   Ht 5\' 8"  (1.727 m)   Wt 195 lb (88.5 kg)   SpO2 100%   BMI 29.65 kg/m   Visual Acuity Right Eye Distance:   Left Eye Distance:   Bilateral Distance:    Right Eye Near:   Left Eye Near:    Bilateral Near:     Physical Exam  Constitutional: She appears well-developed and well-nourished. No distress.  Musculoskeletal:       Right ankle: She exhibits swelling. Tenderness. Medial malleolus tenderness found. Achilles tendon normal.  Mild tenderness over lower calf area  Skin: She is not diaphoretic.  Nursing note and vitals reviewed.    UC Treatments / Results  Labs (all labs ordered are listed, but only abnormal results are displayed) Labs Reviewed - No data to display  EKG  EKG Interpretation None       Radiology [place] Venous Img Lower Unilateral Right  Result Date: 11/16/2017 CLINICAL DATA:  Right leg pain and swelling for several days EXAM: RIGHT LOWER EXTREMITY VENOUS DOPPLER ULTRASOUND TECHNIQUE: Gray-scale sonography with graded compression, as well as color Doppler and duplex ultrasound were performed to evaluate the lower extremity deep venous systems from the level of the common femoral vein and including the common femoral, femoral, profunda femoral, popliteal and calf veins including the posterior tibial, peroneal and gastrocnemius veins when visible. The superficial great saphenous vein was also interrogated. Spectral Doppler was utilized to evaluate flow at rest and with distal augmentation maneuvers in the common femoral, femoral and popliteal veins. COMPARISON:  None. FINDINGS: Contralateral Common Femoral Vein: Respiratory phasicity is normal and symmetric with the symptomatic side. No evidence of thrombus. Normal compressibility. Common Femoral Vein: No evidence of thrombus. Normal compressibility, respiratory phasicity and response to augmentation. Saphenofemoral  Junction: No evidence of thrombus. Normal compressibility and flow on color Doppler imaging. Profunda Femoral Vein: No evidence of thrombus. Normal compressibility and flow on color Doppler imaging. Femoral Vein: No evidence of thrombus. Normal compressibility, respiratory phasicity and response to augmentation. Popliteal Vein: No evidence of thrombus. Normal compressibility, respiratory phasicity and response to augmentation. Calf Veins: No evidence of thrombus. Normal compressibility and flow on  color Doppler imaging. Superficial Great Saphenous Vein: No evidence of thrombus. Normal compressibility. Venous Reflux:  None. Other Findings:  None. IMPRESSION: No evidence of deep venous thrombosis. Electronically Signed   By: Alcide CleverMark  Lukens M.D.   On: 11/16/2017 15:44    Procedures Procedures (including critical care time)  Medications Ordered in UC Medications - No data to display   Initial Impression / Assessment and Plan / UC Course  I have reviewed the triage vital signs and the nursing notes.  Pertinent labs & imaging results that were available during my care of the patient were reviewed by me and considered in my medical decision making (see chart for details).       Final Clinical Impressions(s) / UC Diagnoses   Final diagnoses:  Right ankle pain, unspecified chronicity  Right leg pain    ED Discharge Orders        Ordered    US Venous Img Lower Unilateral Right     11/16/17 1411       1. possible diagnoses reviewed with patient 2. Recommend lower extremity US to rule out DVT due to recent h/o prolonged immobilization; [facility] 3. Recommend supportive treatment with rest, ice, otc analgesics 4. Follow-up prn if symptoms worsen or don't improve  Controlled Substance Prescriptions [place] Controlled Substance Registry consulted? Not Applicable   Payton Mccallumonty, Lesleigh Hughson, MD 11/16/17 [phone]

## 2017-11-16 NOTE — Telephone Encounter (Signed)
Discussed LE US (negative for DVT) with patient; and RXs sent

## 2018-01-19 DIAGNOSIS — I498 Other specified cardiac arrhythmias: Secondary | ICD-10-CM | POA: Diagnosis not present

## 2018-01-19 DIAGNOSIS — R110 Nausea: Secondary | ICD-10-CM | POA: Diagnosis not present

## 2018-01-19 DIAGNOSIS — M25512 Pain in left shoulder: Secondary | ICD-10-CM | POA: Diagnosis not present

## 2018-01-19 DIAGNOSIS — R0789 Other chest pain: Secondary | ICD-10-CM | POA: Diagnosis not present

## 2018-01-19 DIAGNOSIS — R42 Dizziness and giddiness: Secondary | ICD-10-CM | POA: Diagnosis not present

## 2018-01-19 DIAGNOSIS — R9431 Abnormal electrocardiogram [ECG] [EKG]: Secondary | ICD-10-CM | POA: Diagnosis not present

## 2018-09-04 IMAGING — US US EXTREM LOW VENOUS*R*
1 series · 13 of 24 positions shown · non-contrast
Comparison: None.

CLINICAL DATA: Right leg pain and swelling for several days



[Series 1: us extrem low venous*right* · 0.08mm/px · 13 of 33 slices shown]
[im 1/33]
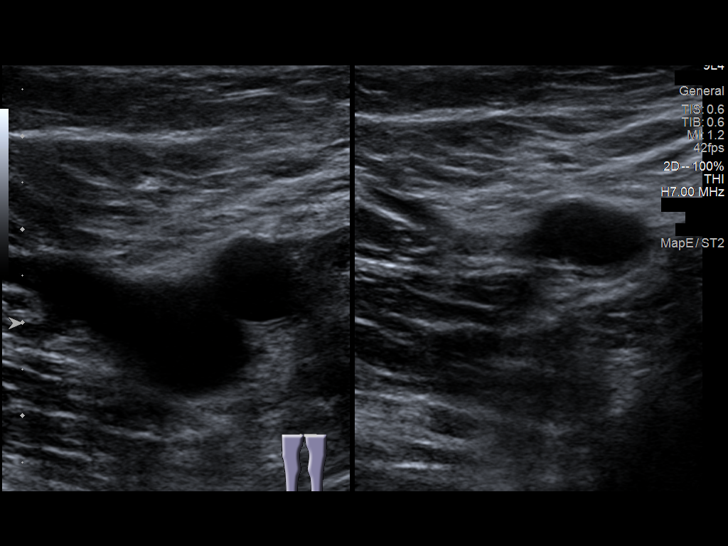
[im 3/33]
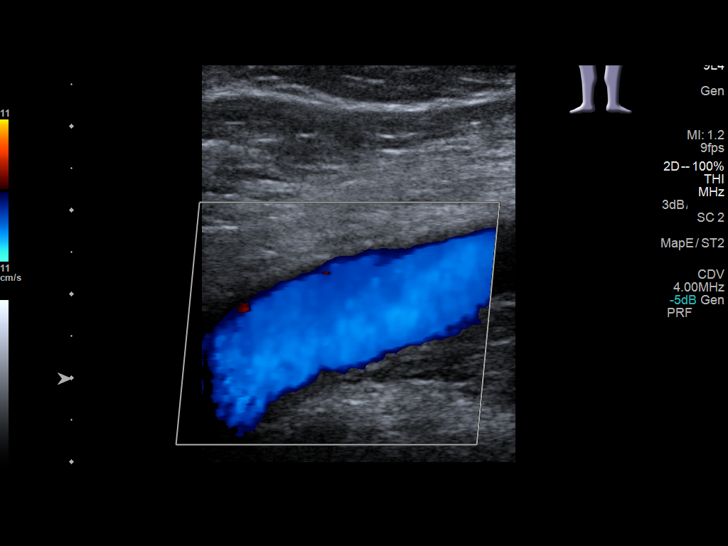
[im 6/33]
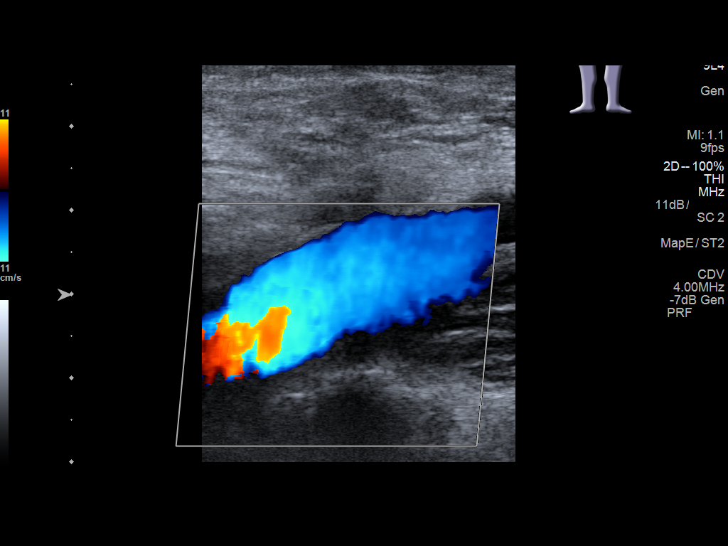
[im 9/33]
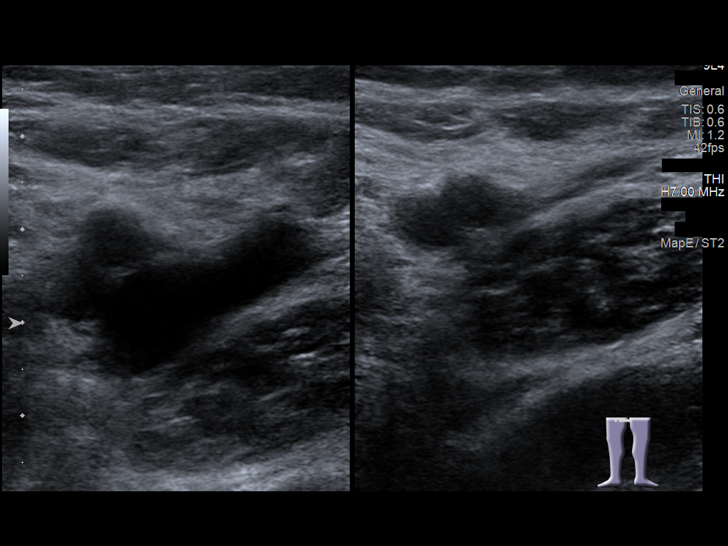
[im 12/33]
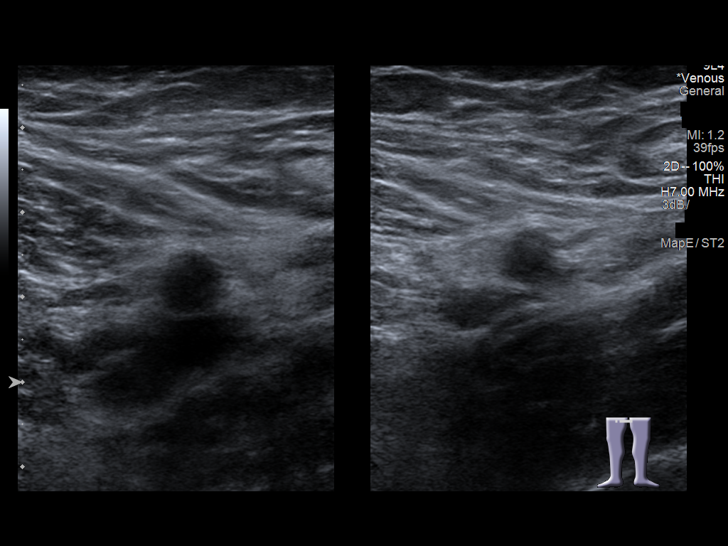
[im 14/33]
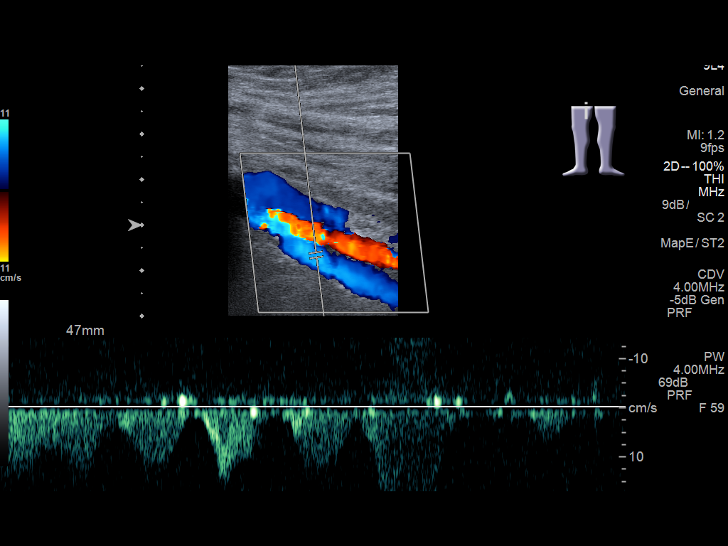
[im 17/33]
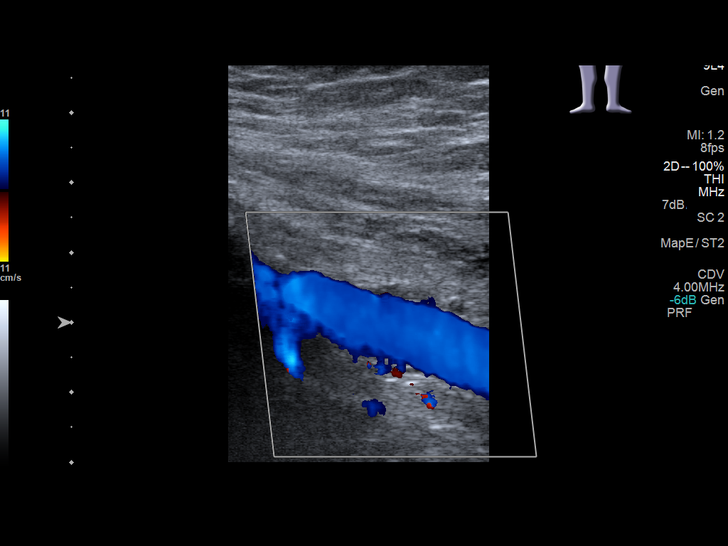
[im 19/33]
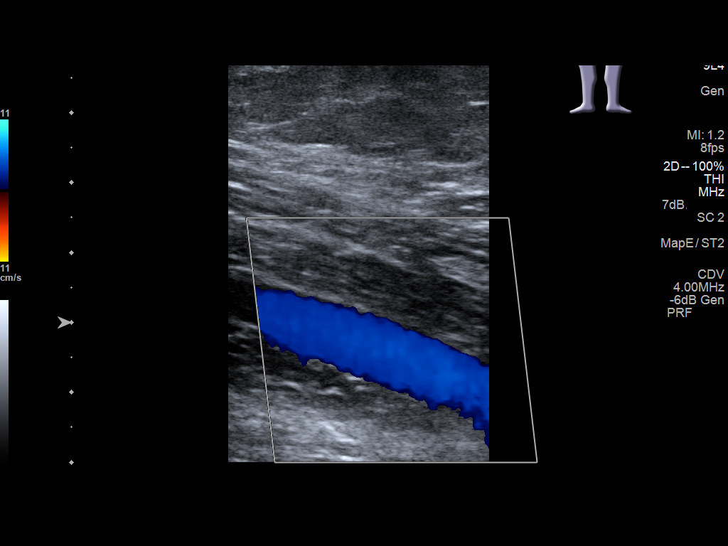
[im 21/33]
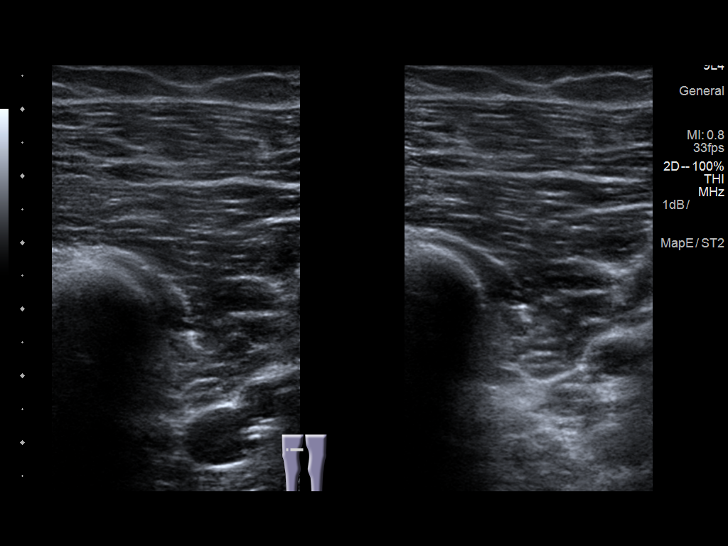
[im 24/33]
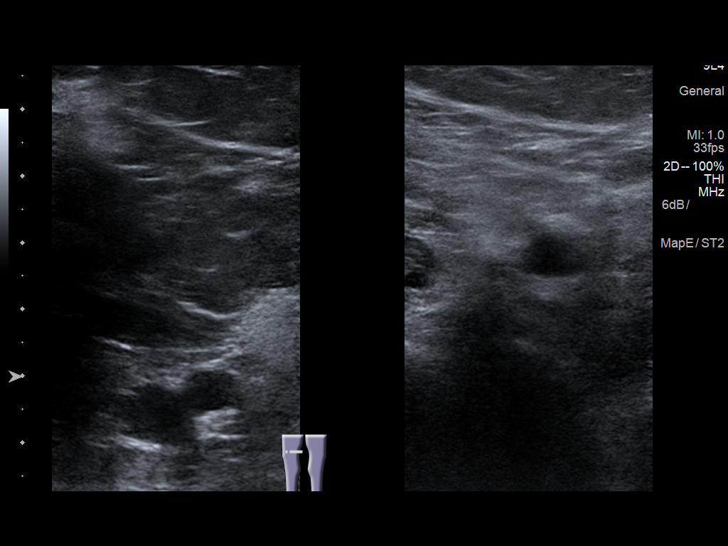
[im 27/33]
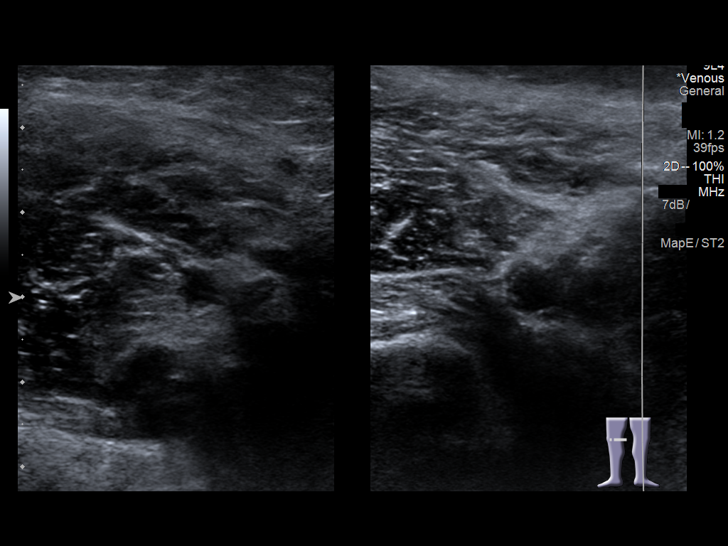
[im 30/33]
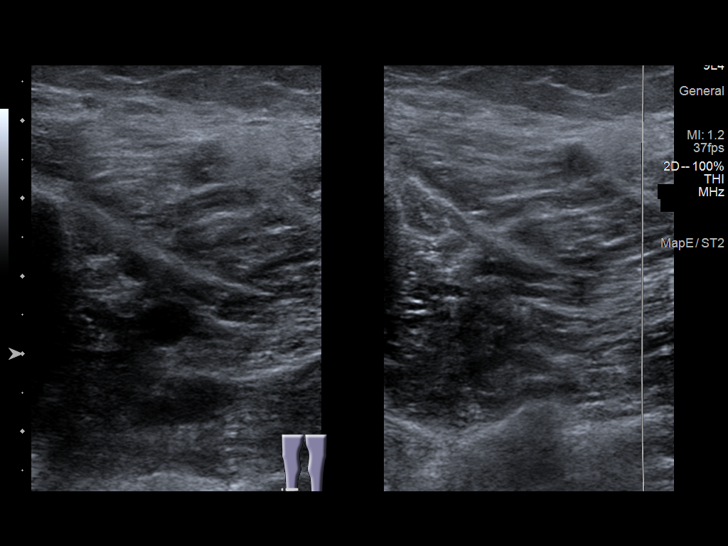
[im 33/33]
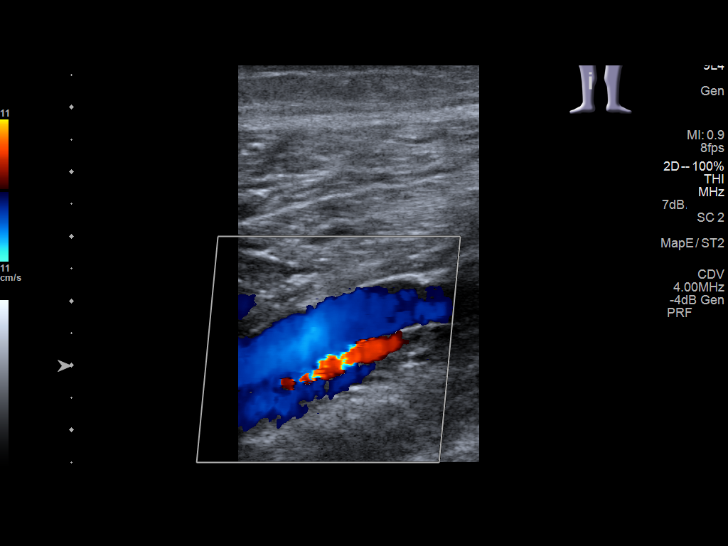

[13 of 24 positions shown; findings below may reference images not displayed]

FINDINGS: Contralateral Common Femoral Vein: Respiratory phasicity is normal
and symmetric with the symptomatic side. No evidence of thrombus.
Normal compressibility.

Common Femoral Vein: No evidence of thrombus. Normal
compressibility, respiratory phasicity and response to augmentation.

Saphenofemoral Junction: No evidence of thrombus. Normal
compressibility and flow on color Doppler imaging.

Profunda Femoral Vein: No evidence of thrombus. Normal
compressibility and flow on color Doppler imaging.

Femoral Vein: No evidence of thrombus. Normal compressibility,
respiratory phasicity and response to augmentation.

Popliteal Vein: No evidence of thrombus. Normal compressibility,
respiratory phasicity and response to augmentation.

Calf Veins: No evidence of thrombus. Normal compressibility and flow
on color Doppler imaging.

Superficial Great Saphenous Vein: No evidence of thrombus. Normal
compressibility.

Venous Reflux:  None.

Other Findings:  None.
IMPRESSION: No evidence of deep venous thrombosis.

## 2019-06-19 DIAGNOSIS — Z79899 Other long term (current) drug therapy: Secondary | ICD-10-CM | POA: Diagnosis not present

## 2019-06-19 DIAGNOSIS — L700 Acne vulgaris: Secondary | ICD-10-CM | POA: Diagnosis not present

## 2019-07-20 DIAGNOSIS — L700 Acne vulgaris: Secondary | ICD-10-CM | POA: Diagnosis not present

## 2019-07-20 DIAGNOSIS — Z79899 Other long term (current) drug therapy: Secondary | ICD-10-CM | POA: Diagnosis not present

## 2019-08-20 DIAGNOSIS — Z79899 Other long term (current) drug therapy: Secondary | ICD-10-CM | POA: Diagnosis not present

## 2019-08-20 DIAGNOSIS — L700 Acne vulgaris: Secondary | ICD-10-CM | POA: Diagnosis not present

## 2019-08-27 DIAGNOSIS — Z79899 Other long term (current) drug therapy: Secondary | ICD-10-CM | POA: Diagnosis not present

## 2019-09-21 DIAGNOSIS — L700 Acne vulgaris: Secondary | ICD-10-CM | POA: Diagnosis not present

## 2019-09-21 DIAGNOSIS — Z79899 Other long term (current) drug therapy: Secondary | ICD-10-CM | POA: Diagnosis not present

## 2019-09-29 DIAGNOSIS — Z20828 Contact with and (suspected) exposure to other viral communicable diseases: Secondary | ICD-10-CM | POA: Diagnosis not present
